# Patient Record
Sex: Male | Born: 1945 | Race: White | Hispanic: Yes | Marital: Married | Smoking: Former smoker
Health system: Southern US, Community
[De-identification: ages and names within clinical notes are randomized; demographics above are authoritative.]

## PROBLEM LIST (undated history)

## (undated) DIAGNOSIS — N289 Disorder of kidney and ureter, unspecified: Secondary | ICD-10-CM

## (undated) DIAGNOSIS — I10 Essential (primary) hypertension: Secondary | ICD-10-CM

## (undated) DIAGNOSIS — Z992 Dependence on renal dialysis: Secondary | ICD-10-CM

## (undated) DIAGNOSIS — E119 Type 2 diabetes mellitus without complications: Secondary | ICD-10-CM

## (undated) DIAGNOSIS — N186 End stage renal disease: Secondary | ICD-10-CM

## (undated) HISTORY — PX: HERNIA REPAIR: SHX51

---

## 2009-12-11 ENCOUNTER — Inpatient Hospital Stay: Payer: Self-pay | Admitting: Internal Medicine

## 2010-01-04 ENCOUNTER — Inpatient Hospital Stay: Payer: Self-pay | Admitting: Internal Medicine

## 2010-01-25 ENCOUNTER — Ambulatory Visit: Payer: Self-pay | Admitting: Vascular Surgery

## 2010-02-21 ENCOUNTER — Emergency Department: Payer: Self-pay | Admitting: Unknown Physician Specialty

## 2010-03-13 ENCOUNTER — Ambulatory Visit: Payer: Self-pay | Admitting: Vascular Surgery

## 2010-03-22 ENCOUNTER — Ambulatory Visit: Payer: Self-pay | Admitting: Vascular Surgery

## 2010-04-05 ENCOUNTER — Ambulatory Visit: Payer: Self-pay | Admitting: Surgery

## 2010-04-12 ENCOUNTER — Ambulatory Visit: Payer: Self-pay | Admitting: Surgery

## 2010-05-20 ENCOUNTER — Ambulatory Visit: Payer: Self-pay | Admitting: Vascular Surgery

## 2010-06-04 ENCOUNTER — Inpatient Hospital Stay: Payer: Self-pay | Admitting: Internal Medicine

## 2016-01-07 ENCOUNTER — Encounter: Payer: Self-pay | Admitting: Emergency Medicine

## 2016-01-07 ENCOUNTER — Emergency Department
Admission: EM | Admit: 2016-01-07 | Discharge: 2016-01-07 | Disposition: A | Discharge status: Home / Self Care | Payer: Medicare Other | Attending: Emergency Medicine | Admitting: Emergency Medicine

## 2016-01-07 DIAGNOSIS — Z87891 Personal history of nicotine dependence: Secondary | ICD-10-CM | POA: Insufficient documentation

## 2016-01-07 DIAGNOSIS — Z992 Dependence on renal dialysis: Secondary | ICD-10-CM | POA: Diagnosis not present

## 2016-01-07 DIAGNOSIS — I120 Hypertensive chronic kidney disease with stage 5 chronic kidney disease or end stage renal disease: Secondary | ICD-10-CM | POA: Diagnosis not present

## 2016-01-07 DIAGNOSIS — N186 End stage renal disease: Secondary | ICD-10-CM | POA: Diagnosis not present

## 2016-01-07 DIAGNOSIS — E119 Type 2 diabetes mellitus without complications: Secondary | ICD-10-CM | POA: Diagnosis not present

## 2016-01-07 HISTORY — DX: Essential (primary) hypertension: I10

## 2016-01-07 HISTORY — DX: Type 2 diabetes mellitus without complications: E11.9

## 2016-01-07 HISTORY — DX: Disorder of kidney and ureter, unspecified: N28.9

## 2016-01-07 LAB — CBC WITH DIFFERENTIAL/PLATELET
BASOS ABS: 0 10*3/uL (ref 0–0.1)
BASOS PCT: 1 %
Eosinophils Absolute: 0.5 10*3/uL (ref 0–0.7)
Eosinophils Relative: 7 %
HEMATOCRIT: 30.2 % — AB (ref 40.0–52.0)
Hemoglobin: 10.2 g/dL — ABNORMAL LOW (ref 13.0–18.0)
Lymphocytes Relative: 22 %
Lymphs Abs: 1.5 10*3/uL (ref 1.0–3.6)
MCH: 32.4 pg (ref 26.0–34.0)
MCHC: 33.7 g/dL (ref 32.0–36.0)
MCV: 96.2 fL (ref 80.0–100.0)
MONO ABS: 0.6 10*3/uL (ref 0.2–1.0)
Monocytes Relative: 9 %
NEUTROS PCT: 61 %
Neutro Abs: 4.3 10*3/uL (ref 1.4–6.5)
PLATELETS: 156 10*3/uL (ref 150–440)
RBC: 3.14 MIL/uL — ABNORMAL LOW (ref 4.40–5.90)
RDW: 14.3 % (ref 11.5–14.5)
WBC: 6.9 10*3/uL (ref 3.8–10.6)

## 2016-01-07 LAB — COMPREHENSIVE METABOLIC PANEL
ALBUMIN: 3.3 g/dL — AB (ref 3.5–5.0)
ALT: 17 U/L (ref 17–63)
AST: 20 U/L (ref 15–41)
Alkaline Phosphatase: 114 U/L (ref 38–126)
Anion gap: 14 (ref 5–15)
BILIRUBIN TOTAL: 1 mg/dL (ref 0.3–1.2)
BUN: 77 mg/dL — AB (ref 6–20)
CO2: 27 mmol/L (ref 22–32)
Calcium: 9.5 mg/dL (ref 8.9–10.3)
Chloride: 92 mmol/L — ABNORMAL LOW (ref 101–111)
Creatinine, Ser: 11.14 mg/dL — ABNORMAL HIGH (ref 0.61–1.24)
GFR calc Af Amer: 5 mL/min — ABNORMAL LOW (ref >60)
GFR calc non Af Amer: 4 mL/min — ABNORMAL LOW (ref >60)
GLUCOSE: 221 mg/dL — AB (ref 65–99)
POTASSIUM: 4.5 mmol/L (ref 3.5–5.1)
Sodium: 133 mmol/L — ABNORMAL LOW (ref 135–145)
Total Protein: 7 g/dL (ref 6.5–8.1)

## 2016-01-07 NOTE — ED Notes (Signed)
Interpreter with pt. Pt alert. Family with pt.

## 2016-01-07 NOTE — ED Notes (Signed)
Patient states he is here for dialysis.  [place] [facility] [place] [facility] [place] and had a family emergency, so [place].  Patient has dialysis on Monday, Wednesday, and Friday.  Last dialysis treatment on Friday March 3.  Patient denies all complaints.

## 2016-01-07 NOTE — ED Notes (Signed)
Pt states he had a death in the family and pt got here today from [place]. Pt due for dialysis today, last dialysis was 3days ago.  No sob. No chest pain.  Pt states i just want my dialysis and go home.  md in with pt.

## 2016-01-07 NOTE — ED Notes (Signed)
md in with pt.  Interpreter with pt.  Pt left and will return tomorrow for dialysis.  Family with pt.

## 2016-01-07 NOTE — Discharge Instructions (Signed)
Please return to the emergency room immediately if you develop chest pain, trouble breathing, or become short of breath, or other concerns arise. We have discussed, you were offered admission to the hospital, but indicated that you need to spend the evening with family. You are due for your dialysis and missing a treatment or going additional time without dialysis can be life threatening or result in death.  Please return to the emergency room tomorrow morning for reevaluation to have your dialysis performed.  Dilisis (Dialysis) La dilisis es un procedimiento que reemplaza parte de la funcin que Apache Corporationrealizan los riones sanos. Se realiza cuando se pierde alrededor del 85 al 90% de la funcin renal. Tambin puede indicarse antes si tuviera la posibilidad de que los sntomas mejoraran con la dilisis. Durante la dilisis, se eliminan los desechos, sales y Sports coachagua en exceso de la Elizabethsangre, y se Engineer, sitemantiene el nivel de ciertas sustancias qumicas en la sangre (como el potasio). La dilisis se realiza en sesiones. Las sesiones de dilisis se continan hasta que los riones Comptonmejoran. Si los riones no mejoran, como sucede en la enfermedad renal terminal, la dilisis se contina de por vida o hasta que pueda recibir un nuevo rin (trasplante renal). Hay dos tipos de dilisis: hemodilisis y dilisis peritoneal. QU ES LA HEMODILISIS?  La hemodilisis es un tipo de dilisis en la que se utiliza una mquina llamada dializador para Hydrologistfiltrar la Dixie Unionsangre. Antes de comenzar la hemodilisis, le harn una ciruga para crear un sitio en el que la sangre pueda extraerse del cuerpo y volver a ingresar en l (acceso vascular). Hay tres tipos de accesos vasculares:  Fstula arteriovenosa. Para crear este tipo de acceso, se conecta una arteria a una vena (generalmente en el brazo). La fstula demora entre 1 y 6 meses para desarrollarse despus de la Azerbaijanciruga. Si se desarrolla adecuadamente, generalmente su duracin es mayor que los  otros tipos de accesos vasculares. Tambin es menos probable que se infecte y se formen cogulos sanguneos.  Injerto arteriovenoso. Para crear este tipo de acceso, se conectan una arteria y Burkina Fasouna vena del brazo con un tubo. El injerto puede usarse de 2 a 3 semanas despus de la Azerbaijanciruga.  Catter venoso. Para crear este tipo de acceso, se coloca un tubo delgado y flexible (catter) en una vena grande del cuello, el trax o la ingle. El catter puede usarse inmediatamente. Por lo general, se Botswanausa como un acceso temporario cuando es necesario que la dilisis comience de inmediato. Durante la hemodilisis, la Quest Diagnosticssangre sale del cuerpo a travs del Teacher, adult educationacceso. Viaja a travs del tubo al dializador, donde se Research scientist (medical)filtra. Luego la sangre retorna al cuerpo a travs de otro tubo. La hemodilisis generalmente la realiza un mdico en el hospital o en un centro de dilisis, tres veces por semana. Las sesiones duran entre 3 y 4 horas. Tambin puede Allied Waste Industrieshacerse en el hogar, con la ayuda de una persona entrenada.  QU ES LA DILISIS PERITONEAL? La dilisis peritoneal es un tipo de dilisis en el que se Botswanausa como filtro la delgada membrana que cubre el abdomen (peritoneo). Antes de comenzar con la dilisis peritoneal, le harn una ciruga para colocar un catter en el abdomen. El catter se usar para introducir y Brewing technologistextraer del abdomen un lquido llamado dialisato. Al inicio de la sesin, llenarn su abdomen con dialisato. Durante la sesin, los desechos, las sales y los lquidos en exceso de la sangre pasan a travs del peritoneo y Albionhacia el dialisato. El dialisato se drena  del cuerpo al finalizar la sesin. El proceso de llenar y drenar el dialisato se llama intercambio. Los intercambios se repiten [employer] haya usado todo el dialisato del da. La dilisis peritoneal puede llevarse a cabo en el hogar o en casi cualquier Interior and spatial designer. Se realiza [employer]. Es posible que necesite hasta cinco intercambios por Futures trader. La cantidad de tiempo que  permanece el dialisato en el organismo entre los intercambios se llama tiempo de permanencia. El tiempo de permanencia depende del nmero de intercambios necesarios y de las caractersticas del peritoneo. Generalmente, oscila entre 1,5y 3horas. Podr llevar una vida normal Cendant Corporation. Como Civil Service fast streamer, los intercambios podrn realizarse durante la noche, Sycamore duerme, con un dispositivo Careers information officer. QU TIPO DE DILISIS DEBO ELEGIR?  Tanto la hemodilisis como la dilisis peritoneal tienen ventajas y desventajas. Hable con su mdico acerca de qu tipo de dilisis es el mejor para usted. Hay que considerar sus preferencias, su estilo de vida y su enfermedad. En algunos casos, puede elegirse solo un tipo de dilisis.  Ventajas de la hemodilisis  Se realiza con menos frecuencia que la dilisis peritoneal.  Otra persona puede hacer la dilisis por usted.  Si concurre a Presenter, broadcasting en dilisis, Garment/textile technologist los problemas inmediatamente.  En el centro de dilisis, podr interactuar con otras personas que se estn haciendo dilisis. Aqu podr encontrar apoyo emocional. Desventajas de la hemodilisis  La hemodilisis puede causar calambres e hipotensin arterial. Puede dejarle una sensacin de [employer] en que le realizan el [place].  Si concurre a un centro de dilisis, deber concertar citas semanales en los horarios disponibles del centro.  Deber tener ms cuidado cuando viaje. Si concurre a Heritage manager de dilisis ser necesario que haga arreglos para Clinical research associate un centro de dilisis cercano a su casa. Si realiza el tratamiento en su casa, ser necesario que lleve el dializador con usted hasta su lugar de destino.  Ser necesario que evite ms alimentos de los que debe evitar en la dilisis peritoneal. Ventajas de la dilisis peritoneal  Es menos probable que cause calambres e hipotensin arterial.  Podr realizar los intercambios  usted mismo, donde se encuentre, incluso cuando viaje.  No es necesario que evite tantos alimentos como en la hemodilisis. Desventajas de la dilisis peritoneal  Debe realizarse con ms frecuencia que la hemodilisis.  La dilisis peritoneal requiere destreza con las manos. Tambin debe ser capaz de levantar bolsas.  Tendr que aprender tcnicas de esterilizacin. Deber practicarlas [employer] para reducir el riesgo de infeccin. QU CAMBIOS DEBER HACER EN LA DIETA DURANTE LA DILISIS? Tanto en la hemodilisis como en la dilisis peritoneal se requiere que haga algunos cambios en su dieta. Por ejemplo, deber limitar su ingesta de alimentos con elevado contenido de fsforo y Government social research officer. Tambin deber limitar su ingesta de lquidos. Su nutricionista puede ayudar a planificar sus comidas. Un buen plan de comidas puede mejorar la dilisis y [address]  QU DEBO ESPERAR AL COMENZAR LA DILISIS? Adaptarse al tratamiento de dilisis, a las citas programadas y a Field seismologist. Puede que sea necesario que deje de trabajar y no pueda hacer algunas de las cosas que normalmente hace. Es probable que se sienta ansioso o deprimido cuando inicie la dilisis. Con el tiempo, muchas personas se sienten mejor debido a la dilisis. Algunos pueden volver a trabajar despus de Advertising copywriter cambios, como disminuir la intensidad del [place]. DNDE Raelyn Mora MS INFORMACIN?  Fundacin Nacional del Rin (National Kidney Foundation): www.kidney.org  Asociacin Americana de Pacientes Renales (American Association of Kidney Patients, AAKP): ResidentialShow.is  Fondo Americano para Problemas Renales (American Kidney Fund): www.kidneyfund.org   Esta informacin no tiene Theme park manager el consejo del mdico. Asegrese de hacerle al mdico cualquier pregunta que tenga.   Document Released: 10/20/2005 Document Revised: 11/10/2014 Elsevier Interactive Patient Education Microsoft.

## 2016-01-07 NOTE — ED Provider Notes (Addendum)
[facility] Emergency Department Provider Note  ____________________________________________  Time seen: Approximately 9:18 PM  I have reviewed the triage vital signs and the nursing notes.   HISTORY  Chief Complaint needs dialysis     HPI Jermaine Carpenter is a 70 y.o. male a history of end-stage renal disease on Monday Wednesday Friday dialysis. Last treatment was Friday, and he came here for a funeral. He reports that he is due for dialysis today. He denies any complaint. He isn't established dialysis patient in [place] and generally gets his dialysis through his fistula in the right arm. He denies any shortness of breath, chest pain, trouble breathing, swelling.   Past Medical History  Diagnosis Date  . Renal disorder     ESRD, dialysis  . Diabetes mellitus without complication (HCC)   . Hypertension     There are no active problems to display for this patient.   Past Surgical History  Procedure Laterality Date  . Hernia repair      No current outpatient prescriptions on file.  Allergies Review of patient's allergies indicates no known allergies.  No family history on file.  Social History Social History  Substance Use Topics  . Smoking status: Former Games developer  . Smokeless tobacco: Never Used  . Alcohol Use: No    Review of Systems Constitutional: No fever/chills Eyes: No visual changes. ENT: No sore throat. Cardiovascular: Denies chest pain. Respiratory: Denies shortness of breath. Gastrointestinal: No abdominal pain.  No nausea, no vomiting.  No diarrhea.  No constipation. Genitourinary: Negative for dysuria. Musculoskeletal: Negative for back pain. Skin: Negative for rash. Neurological: Negative for headaches, focal weakness or numbness.  10-point ROS otherwise negative.  ____________________________________________   PHYSICAL EXAM:  VITAL SIGNS: ED Triage Vitals  Enc Vitals Group     BP --      Pulse Rate 01/07/16  1701 77     Resp 01/07/16 1701 18     Temp 01/07/16 1701 98.5 F (36.9 C)     Temp Source 01/07/16 1701 Oral     SpO2 01/07/16 1701 96 %     Weight 01/07/16 1701 157 lb 10.1 oz (71.5 kg)     Height 01/07/16 1701  (1.702 m)     Head Cir --      Peak Flow --      Pain Score 01/07/16 1703 0     Pain Loc --      Pain Edu? --      Excl. in GC? --    Constitutional: Alert and oriented. Well appearing and in no acute distress. Eyes: Conjunctivae are normalOn the left. 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 Nose: No congestion/rhinnorhea. Mouth/Throat: Mucous membranes are moist.  Oropharynx non-erythematous. Neck: No stridor.   Cardiovascular: Normal rate, regular rhythm. Grossly normal heart sounds.  Good peripheral circulation. Respiratory: Normal respiratory effort.  No retractions. Lungs CTAB. Gastrointestinal: Soft and nontender. No distention. No abdominal bruits. No CVA tenderness. Musculoskeletal: No lower extremity tenderness nor edema.  Neurologic:  Normal speech and language. No gross focal neurologic deficits are appreciated. No gait instability. Skin:  Skin is warm, dry and intact. No rash noted. Right upper extremity fistula with normal thrill no erythema. Psychiatric: Mood and affect are normal. Speech and behavior are normal.  ____________________________________________   LABS (all labs ordered are listed, but only abnormal results are displayed)  Labs Reviewed  CBC WITH DIFFERENTIAL/PLATELET - Abnormal; Notable for the following:    RBC 3.14 (*)  Hemoglobin 10.2 (*)    HCT 30.2 (*)    All other components within normal limits  COMPREHENSIVE METABOLIC PANEL - Abnormal; Notable for the following:    Sodium 133 (*)    Chloride 92 (*)    Glucose, Bld 221 (*)    BUN 77 (*)    Creatinine, Ser 11.14 (*)    Albumin 3.3 (*)    GFR calc non Af Amer 4 (*)    GFR calc Af Amer 5 (*)    All other components within normal limits    ____________________________________________  EKG   ____________________________________________  RADIOLOGY   ____________________________________________   PROCEDURES  Procedure(s) performed: None  Critical Care performed: No  ____________________________________________   INITIAL IMPRESSION / ASSESSMENT AND PLAN / ED COURSE  Pertinent labs & imaging results that were available during my care of the patient were reviewed by me and considered in my medical decision making (see chart for details).  Patient presents for evaluation for scheduled dialysis which he has not had because of travel. He appears stable. He is in no distress. No hypoxia and clear lung sounds. No evidence of volume overload or failure. Potassium is normal at 4.5. He has no complaint to suggest emergent need for dialysis and his clinical exam also supports that he is not in distress and non-requirement of emergent dialysis at this time.  I called and discussed the patient's case with Dr. Ronn Melena of nephrology who reviewed the patient's case and notes and advises that unfortunately the patient's only option to receive dialysis and get set up in the local area while traveling would be to admit him tonight and have nephrology see him for dialysis tomorrow morning. Discussed with the patient as family, they are amenable to this plan. I did discuss with nephrology that patient is currently asymptomatic, he appears stable and does not seem to be in need of emergent dialysis, however given the patient's anticipated need for dialysis and that he'll be staying in the [place] area for several days they have very few options but to admit him at this time and establish dialysis schedule, otherwise only other option might be to come back if he develops symptoms such as dyspnea or a need for emergent dialysis which is suboptimal, and potentially an unsafe disposition at this time given he cannot be started on outpatient  regimen promptly.  Patient admitted ____________________________________________   FINAL CLINICAL IMPRESSION(S) / ED DIAGNOSES  Final diagnoses:  End stage renal disease on dialysis [facility])      Sharyn Creamer, MD 01/07/16 2135   ----------------------------------------- 10:50 PM on 01/07/2016 -----------------------------------------  The patient changed his mind and refused admission as he reports that he needs time with his family this evening given the loss and death of his daughter. He clearly communicated understanding the reason for admission and the risks of not having dialysis including possible death. His family will be taking him home and he will be returning to the emergency room at about 5:30 or 6 AM in the morning for reevaluation to set up his dialysis. The patient and his family had a lengthy discussion with myself via Spanish interpreter and everyone seems to be understanding and in agreement with the plan for him to be discharged, though I did recommend admission, but he will return in the morning or sooner if he develops any concerning symptoms which were reviewed carefully. He understands the plan and will be returning to the emergency room for reevaluation to have dialysis initiated tomorrow.  Sharyn CreamerMark Zori Benbrook, MD 01/07/16 2252

## 2016-01-08 ENCOUNTER — Observation Stay
Admission: EM | Admit: 2016-01-08 | Discharge: 2016-01-08 | Discharge status: Against Medical Advice | Payer: Medicare Other | Attending: Specialist | Admitting: Specialist

## 2016-01-08 ENCOUNTER — Emergency Department: Payer: Medicare Other

## 2016-01-08 ENCOUNTER — Ambulatory Visit
Admission: RE | Admit: 2016-01-08 | Discharge: 2016-01-08 | Disposition: A | Discharge status: Home / Self Care | Payer: Medicare Other | Source: Ambulatory Visit | Attending: Internal Medicine | Admitting: Internal Medicine

## 2016-01-08 ENCOUNTER — Ambulatory Visit: Payer: Medicare Other

## 2016-01-08 ENCOUNTER — Encounter: Payer: Self-pay | Admitting: Emergency Medicine

## 2016-01-08 DIAGNOSIS — Z79899 Other long term (current) drug therapy: Secondary | ICD-10-CM | POA: Insufficient documentation

## 2016-01-08 DIAGNOSIS — J189 Pneumonia, unspecified organism: Secondary | ICD-10-CM | POA: Insufficient documentation

## 2016-01-08 DIAGNOSIS — D631 Anemia in chronic kidney disease: Secondary | ICD-10-CM | POA: Diagnosis not present

## 2016-01-08 DIAGNOSIS — Z87891 Personal history of nicotine dependence: Secondary | ICD-10-CM | POA: Diagnosis not present

## 2016-01-08 DIAGNOSIS — E1122 Type 2 diabetes mellitus with diabetic chronic kidney disease: Principal | ICD-10-CM | POA: Insufficient documentation

## 2016-01-08 DIAGNOSIS — Z992 Dependence on renal dialysis: Secondary | ICD-10-CM | POA: Insufficient documentation

## 2016-01-08 DIAGNOSIS — N186 End stage renal disease: Secondary | ICD-10-CM

## 2016-01-08 DIAGNOSIS — J988 Other specified respiratory disorders: Secondary | ICD-10-CM | POA: Diagnosis not present

## 2016-01-08 DIAGNOSIS — I440 Atrioventricular block, first degree: Secondary | ICD-10-CM | POA: Diagnosis not present

## 2016-01-08 DIAGNOSIS — R05 Cough: Secondary | ICD-10-CM | POA: Insufficient documentation

## 2016-01-08 DIAGNOSIS — R778 Other specified abnormalities of plasma proteins: Secondary | ICD-10-CM | POA: Insufficient documentation

## 2016-01-08 DIAGNOSIS — I447 Left bundle-branch block, unspecified: Secondary | ICD-10-CM | POA: Diagnosis not present

## 2016-01-08 DIAGNOSIS — Z794 Long term (current) use of insulin: Secondary | ICD-10-CM | POA: Diagnosis not present

## 2016-01-08 DIAGNOSIS — R7989 Other specified abnormal findings of blood chemistry: Secondary | ICD-10-CM

## 2016-01-08 DIAGNOSIS — I120 Hypertensive chronic kidney disease with stage 5 chronic kidney disease or end stage renal disease: Secondary | ICD-10-CM | POA: Insufficient documentation

## 2016-01-08 HISTORY — DX: End stage renal disease: N18.6

## 2016-01-08 HISTORY — DX: Dependence on renal dialysis: Z99.2

## 2016-01-08 LAB — RENAL FUNCTION PANEL
Albumin: 3.1 g/dL — ABNORMAL LOW (ref 3.5–5.0)
Anion gap: 15 (ref 5–15)
BUN: 86 mg/dL — ABNORMAL HIGH (ref 6–20)
CALCIUM: 9.5 mg/dL (ref 8.9–10.3)
CO2: 25 mmol/L (ref 22–32)
CREATININE: 12.18 mg/dL — AB (ref 0.61–1.24)
Chloride: 95 mmol/L — ABNORMAL LOW (ref 101–111)
GFR, EST AFRICAN AMERICAN: 4 mL/min — AB (ref >60)
GFR, EST NON AFRICAN AMERICAN: 4 mL/min — AB (ref >60)
Glucose, Bld: 213 mg/dL — ABNORMAL HIGH (ref 65–99)
PHOSPHORUS: 5.7 mg/dL — AB (ref 2.5–4.6)
Potassium: 4.8 mmol/L (ref 3.5–5.1)
SODIUM: 135 mmol/L (ref 135–145)

## 2016-01-08 LAB — COMPREHENSIVE METABOLIC PANEL
ALK PHOS: 103 U/L (ref 38–126)
ALT: 15 U/L — ABNORMAL LOW (ref 17–63)
ANION GAP: 13 (ref 5–15)
AST: 19 U/L (ref 15–41)
Albumin: 3.2 g/dL — ABNORMAL LOW (ref 3.5–5.0)
BILIRUBIN TOTAL: 0.6 mg/dL (ref 0.3–1.2)
BUN: 87 mg/dL — AB (ref 6–20)
CALCIUM: 9.2 mg/dL (ref 8.9–10.3)
CO2: 27 mmol/L (ref 22–32)
Chloride: 95 mmol/L — ABNORMAL LOW (ref 101–111)
Creatinine, Ser: 11.75 mg/dL — ABNORMAL HIGH (ref 0.61–1.24)
GFR calc Af Amer: 4 mL/min — ABNORMAL LOW (ref >60)
GFR, EST NON AFRICAN AMERICAN: 4 mL/min — AB (ref >60)
GLUCOSE: 274 mg/dL — AB (ref 65–99)
Potassium: 4.5 mmol/L (ref 3.5–5.1)
Sodium: 135 mmol/L (ref 135–145)
TOTAL PROTEIN: 6.8 g/dL (ref 6.5–8.1)

## 2016-01-08 LAB — CBC
HEMATOCRIT: 30.3 % — AB (ref 40.0–52.0)
Hemoglobin: 10.3 g/dL — ABNORMAL LOW (ref 13.0–18.0)
MCH: 32.9 pg (ref 26.0–34.0)
MCHC: 33.9 g/dL (ref 32.0–36.0)
MCV: 97 fL (ref 80.0–100.0)
PLATELETS: 160 10*3/uL (ref 150–440)
RBC: 3.12 MIL/uL — ABNORMAL LOW (ref 4.40–5.90)
RDW: 14.7 % — AB (ref 11.5–14.5)
WBC: 6.1 10*3/uL (ref 3.8–10.6)

## 2016-01-08 LAB — TROPONIN I: Troponin I: 0.11 ng/mL — ABNORMAL HIGH (ref <0.031)

## 2016-01-08 MED ORDER — ISOSORBIDE MONONITRATE ER 30 MG PO TB24: 30.0000 mg | ORAL_TABLET | Freq: Every day | ORAL | Status: DC

## 2016-01-08 MED ORDER — CARVEDILOL 6.25 MG PO TABS: 12.5000 mg | ORAL_TABLET | Freq: Two times a day (BID) | ORAL | Status: DC

## 2016-01-08 MED ORDER — HEPARIN SODIUM (PORCINE) 5000 UNIT/ML IJ SOLN: 5000.0000 [IU] | Freq: Three times a day (TID) | INTRAMUSCULAR | Status: DC

## 2016-01-08 MED ORDER — LEVOFLOXACIN 500 MG PO TABS: 250.0000 mg | ORAL_TABLET | ORAL | Status: DC

## 2016-01-08 MED ORDER — LOSARTAN POTASSIUM 50 MG PO TABS: 100.0000 mg | ORAL_TABLET | Freq: Every day | ORAL | Status: DC

## 2016-01-08 MED ORDER — DOXAZOSIN MESYLATE 1 MG PO TABS: 2.0000 mg | ORAL_TABLET | Freq: Every day | ORAL | Status: DC

## 2016-01-08 MED ORDER — ONDANSETRON HCL 4 MG PO TABS: 4.0000 mg | ORAL_TABLET | Freq: Four times a day (QID) | ORAL | Status: DC | PRN

## 2016-01-08 MED ORDER — INSULIN ASPART 100 UNIT/ML ~~LOC~~ SOLN: 0.0000 [IU] | Freq: Every day | SUBCUTANEOUS | Status: DC

## 2016-01-08 MED ORDER — CALCIUM ACETATE (PHOS BINDER) 667 MG PO CAPS: 1334.0000 mg | ORAL_CAPSULE | Freq: Three times a day (TID) | ORAL | Status: DC

## 2016-01-08 MED ORDER — INSULIN ASPART 100 UNIT/ML ~~LOC~~ SOLN: 0.0000 [IU] | Freq: Three times a day (TID) | SUBCUTANEOUS | Status: DC

## 2016-01-08 MED ORDER — PANTOPRAZOLE SODIUM 40 MG PO TBEC: 40.0000 mg | DELAYED_RELEASE_TABLET | Freq: Every day | ORAL | Status: DC

## 2016-01-08 MED ORDER — ACETAMINOPHEN 650 MG RE SUPP: 650.0000 mg | Freq: Four times a day (QID) | RECTAL | Status: DC | PRN

## 2016-01-08 MED ORDER — ACETAMINOPHEN 325 MG PO TABS: 650.0000 mg | ORAL_TABLET | Freq: Four times a day (QID) | ORAL | Status: DC | PRN

## 2016-01-08 MED ORDER — LEVOFLOXACIN 500 MG PO TABS: 500.0000 mg | ORAL_TABLET | Freq: Once | ORAL | Status: DC

## 2016-01-08 MED ORDER — ONDANSETRON HCL 4 MG/2ML IJ SOLN: 4.0000 mg | Freq: Four times a day (QID) | INTRAMUSCULAR | Status: DC | PRN

## 2016-01-08 NOTE — Progress Notes (Signed)
Patient states to interpreter that he wants to leave. MD notified. AMA form printed and signed. Waiting on family to arrive to pick up patient.

## 2016-01-08 NOTE — H&P (Addendum)
[facility] [place] [facility]   PATIENT NAME: Jermaine Carpenter    MR#:  [id number]  DATE OF BIRTH:  12/13/1945  DATE OF ADMISSION:  01/08/2016  PRIMARY CARE PHYSICIAN: No primary care provider on file.   REQUESTING/REFERRING PHYSICIAN: Dr. Daryel NovemberJonathan Williams  CHIEF COMPLAINT:   Chief Complaint  Patient presents with  . dialysis     HISTORY OF PRESENT ILLNESS:  Jermaine Hughntonio Salone  is a 70 y.o. male with a known history of stage renal disease on hemodialysis, diabetes, hypertension who presented to the hospital as he has missed his dialysis this past Monday. [place] recently [place] to [place] from [place] due to a death in his family. He did not arrange dialysis prior to leaving [place]. His normally scheduled to get hemodialysis on Monday Wednesday Friday and received his hemodialysis this past Friday. He is complaining of some minimal shortness of breath, and a cough which is productive but sometimes green sputum. He denies any fevers, chills, nausea, vomiting, chest pain, abdominal pain or any other associated symptoms. Hospitalist services were contacted further treatment and evaluation.  PAST MEDICAL HISTORY:   Past Medical History  Diagnosis Date  . Renal disorder     ESRD, dialysis  . Diabetes mellitus without complication (HCC)   . Hypertension   . End-stage renal disease on hemodialysis (HCC)     PAST SURGICAL HISTORY:   Past Surgical History  Procedure Laterality Date  . Hernia repair      SOCIAL HISTORY:   Social History  Substance Use Topics  . Smoking status: Former Games developermoker  . Smokeless tobacco: Never Used  . Alcohol Use: No    FAMILY HISTORY:  No family history on file. Pt. Cannot recall his family hx for his parents. He does say that Diabetes runs in his family.    DRUG ALLERGIES:  No Known Allergies  REVIEW OF SYSTEMS:   Review of Systems  Constitutional: Negative for fever and weight loss.  HENT: Negative  for congestion, nosebleeds and tinnitus.   Eyes: Negative for blurred vision, double vision and redness.  Respiratory: Positive for cough, sputum production and shortness of breath. Negative for hemoptysis.   Cardiovascular: Negative for chest pain, orthopnea, leg swelling and PND.  Gastrointestinal: Negative for nausea, vomiting, abdominal pain, diarrhea and melena.  Genitourinary: Negative for dysuria, urgency and hematuria.  Musculoskeletal: Negative for joint pain and falls.  Neurological: Negative for dizziness, tingling, sensory change, focal weakness, seizures, weakness and headaches.  Endo/Heme/Allergies: Negative for polydipsia. Does not bruise/bleed easily.  Psychiatric/Behavioral: Negative for depression and memory loss. The patient is not nervous/anxious.     MEDICATIONS AT HOME:   Prior to Admission medications   Not on File      VITAL SIGNS:  Blood pressure 177/72, pulse 75, temperature 97.5 F (36.4 C), resp. rate 20, height 5\' 7"  (1.702 m), weight 71.215 kg (157 lb), SpO2 97 %.  PHYSICAL EXAMINATION:  Physical Exam  GENERAL:  70 y.o.-year-old patient lying in the bed with no acute distress.  EYES: Pupils equal, round, reactive to light and accommodation. No scleral icterus. Extraocular muscles intact.  HEENT: Head atraumatic, normocephalic. Oropharynx and nasopharynx clear. No oropharyngeal erythema, moist oral mucosa  NECK:  Supple, no jugular venous distention. No thyroid enlargement, no tenderness.  LUNGS: Normal breath sounds bilaterally, no wheezing, rales, some rhonchi b/l. No use of accessory muscles of respiration.  CARDIOVASCULAR: S1, S2 RRR. No murmurs, rubs, gallops, clicks.  ABDOMEN: Soft, nontender, nondistended. Bowel  sounds present. No organomegaly or mass.  EXTREMITIES: No pedal edema, cyanosis, or clubbing. + 2 pedal & radial pulses b/l.   NEUROLOGIC: Cranial nerves II through XII are intact. No focal Motor or sensory deficits appreciated  b/l PSYCHIATRIC: The patient is alert and oriented x 3. Good affect.  SKIN: No obvious rash, lesion, or ulcer.   Right upper extremity AV fistula with good bruit and good thrill.  LABORATORY PANEL:   CBC  Recent Labs Lab 01/08/16 0731  WBC 6.1  HGB 10.3*  HCT 30.3*  PLT 160   ------------------------------------------------------------------------------------------------------------------  Chemistries   Recent Labs Lab 01/08/16 0731  NA 135  K 4.5  CL 95*  CO2 27  GLUCOSE 274*  BUN 87*  CREATININE 11.75*  CALCIUM 9.2  AST 19  ALT 15*  ALKPHOS 103  BILITOT 0.6   ------------------------------------------------------------------------------------------------------------------  Cardiac Enzymes  Recent Labs Lab 01/08/16 0731  TROPONINI 0.11*   ------------------------------------------------------------------------------------------------------------------  RADIOLOGY:  Dg Chest Port 1 View  01/08/2016  CLINICAL DATA:  Renal failure patient with history of smoking, diabetes and hypertension. EXAM: PORTABLE CHEST 1 VIEW COMPARISON:  Radiographs 06/04/2010 and 01/04/2000) FINDINGS: 0717 hours. The heart size is stable for portable technique. Patient has a right-sided aortic arch. The pulmonary vascularity appears normal. There are patchy airspace opacities at both lung bases. No pleural effusion or pneumothorax is seen. The bones appear unremarkable. IMPRESSION: Patchy bibasilar atelectasis or early pneumonia. Correlate clinically. No evidence of edema. Electronically Signed   By: Carey Bullocks M.D.   On: 01/08/2016 07:28     IMPRESSION AND PLAN:   70 year old male with past medical history of end-stage renal disease on hemodialysis, hypertension, diabetes who presents to the hospital having missed his dialysis this past Monday.  #1 end-stage renal disease on hemodialysis-patient is traveling from [place] due to a death in his family.  He did not get dialysis  arranged prior to his travel. -Patient has some mild volume overload having missed his dialysis 2 days ago. [facility] consult nephrology and get him started on hemodialysis.  #2 pneumonia-patient has a productive cough with green-yellow sputum over the past few days. -Clinically he is afebrile, hemodynamically stable with a normal white cell count. -I will start him on Levaquin for now.  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 -We'll obtain his home medications and reconciled them later.  #4. Troponin-this is likely secondary to patient's poor renal clearance. No acute chest pain. -No need for further cardiac workup presently.  #5 anemia of chronic disease- due to ESRD.  - hemoglobin stable we will monitor.  All the records are reviewed and case discussed with ED provider. Management plans discussed with the patient, family and they are in agreement.  CODE STATUS: Full   TOTAL TIME TAKING CARE OF THIS PATIENT: 45 minutes.    Houston Siren M.D on 01/08/2016 at 9:05 AM  Between 7am to 6pm - Pager - [phone]  After 6pm go to [email or web address] - password [email or web address] [facility]  [employer] [facility]  Office  [phone]  CC: Primary care physician; No primary care provider on file.

## 2016-01-08 NOTE — Progress Notes (Signed)
Pre-HD tx,interpretor bedside

## 2016-01-08 NOTE — Progress Notes (Signed)
Post hd tx 

## 2016-01-08 NOTE — Care Management Note (Signed)
Case Management Note  Patient Details  Name: George Hughntonio Hallum MRN: [id number] Date of Birth: 07/22/1946  Subjective/Objective:      Patient here from [place] to bury his daughter. Spoke to grandson at bedside who is fluent in Budaenglish , and explained to him that although the patient is coming in, the HD will most likely NOT be covered, and represents a cost of around $1500. He has signed the OBSV letter. He has stated the plan was to contact [facility] locally where the pt. Had been treated previously, and I have explained that although that is a good ides, the [place] facility will most likely have to set things up, after they transfer the pts. Current information. He has agreed to add this call today. MD made aware.              Action/Plan:   Expected Discharge Date:                  Expected Discharge Plan:     In-House Referral:     Discharge planning Services     Post Acute Care Choice:    Choice offered to:     DME Arranged:    DME Agency:     HH Arranged:    HH Agency:     Status of Service:     Medicare Important Message Given:    Date Medicare IM Given:    Medicare IM give by:    Date Additional Medicare IM Given:    Additional Medicare Important Message give by:     If discussed at Long Length of Stay Meetings, dates discussed:    Additional Comments:  Berna BueCheryl Zyann Mabry, RN 01/08/2016, 9:06 AM

## 2016-01-08 NOTE — ED Provider Notes (Signed)
[facility] Emergency Department Provider Note     Time seen: ----------------------------------------- 7:12 AM on 01/08/2016 -----------------------------------------    I have reviewed the triage vital signs and the nursing notes.   HISTORY  Chief Complaint dialysis     HPI Jermaine Carpenter is a 70 y.o. male who presents to the ER with end-stage renal disease on Monday Wednesday and Friday dialysis. His last treatment was Friday, he is here from out of town. Patient was seen in ER yesterday but wanted to go spend time with family. He does have right AV fistula, has had some shortness of breath but denies any other complaints.   Past Medical History  Diagnosis Date  . Renal disorder     ESRD, dialysis  . Diabetes mellitus without complication (HCC)   . Hypertension     There are no active problems to display for this patient.   Past Surgical History  Procedure Laterality Date  . Hernia repair      Allergies Review of patient's allergies indicates no known allergies.  Social History Social History  Substance Use Topics  . Smoking status: Former Games developer  . Smokeless tobacco: Never Used  . Alcohol Use: No    Review of Systems Constitutional: Negative for fever. Eyes: Negative for visual changes. ENT: Negative for sore throat. Cardiovascular: Negative for chest pain. Respiratory: Positive for shortness of breath Gastrointestinal: Negative for abdominal pain, vomiting and diarrhea. Genitourinary: Negative for dysuria. Musculoskeletal: Negative for back pain. Skin: Negative for rash. Neurological: Negative for headaches, focal weakness or numbness.  10-point ROS otherwise negative.  ____________________________________________   PHYSICAL EXAM:  VITAL SIGNS: ED Triage Vitals  Enc Vitals Group     BP 01/08/16 0648 177/72 mmHg     Pulse Rate 01/08/16 0648 75     Resp 01/08/16 0648 20     Temp 01/08/16 0648 97.5 F (36.4 C)      Temp src --      SpO2 01/08/16 0648 97 %     Weight 01/08/16 0648 157 lb (71.215 kg)     Height 01/08/16 0648  (1.702 m)     Head Cir --      Peak Flow --      Pain Score --      Pain Loc --      Pain Edu? --      Excl. in GC? --     Constitutional: Alert and oriented. Well appearing and in no distress. Eyes: Conjunctivae are normal. PERRL. Normal extraocular movements. ENT   Head: Normocephalic and atraumatic.   Nose: No congestion/rhinnorhea.   Mouth/Throat: Mucous membranes are moist.   Neck: No stridor. Cardiovascular: Normal rate, regular rhythm. Normal and symmetric distal pulses are present in all extremities. No murmurs, rubs, or gallops. Respiratory: Normal respiratory effort without tachypnea nor retractions. Breath sounds are clear and equal bilaterally. No wheezes/rales/rhonchi. Gastrointestinal: Soft and nontender. No distention. No abdominal bruits.  Musculoskeletal: Nontender with normal range of motion in all extremities. No joint effusions.  No lower extremity tenderness nor edema. Neurologic:  Normal speech and language. No gross focal neurologic deficits are appreciated. Speech is normal. No gait instability. Skin:  Skin is warm, dry and intact. No rash noted. Psychiatric: Mood and affect are normal. Speech and behavior are normal. Patient exhibits appropriate insight and judgment. ____________________________________________  ED COURSE:  Pertinent labs & imaging results that were available during my care of the patient were reviewed by me and considered in my  medical decision making (see chart for details). Patient is in no acute distress, will check basic labs and reevaluate. ____________________________________________    LABS (pertinent positives/negatives)  Labs Reviewed  CBC - Abnormal; Notable for the following:    RBC 3.12 (*)    Hemoglobin 10.3 (*)    HCT 30.3 (*)    RDW 14.7 (*)    All other components within normal limits   TROPONIN I - Abnormal; Notable for the following:    Troponin I 0.11 (*)    All other components within normal limits  COMPREHENSIVE METABOLIC PANEL - Abnormal; Notable for the following:    Chloride 95 (*)    Glucose, Bld 274 (*)    BUN 87 (*)    Creatinine, Ser 11.75 (*)    Albumin 3.2 (*)    ALT 15 (*)    GFR calc non Af Amer 4 (*)    GFR calc Af Amer 4 (*)    All other components within normal limits   EKG: Interpreted by me, normal sinus rhythm with a rate of 69 bpm, first-degree AV block, wide QRS, normal QT interval. Left bundle branch block. Left axis deviation  RADIOLOGY Images were viewed by me  Chest x-ray IMPRESSION: Patchy bibasilar atelectasis or early pneumonia. Correlate clinically. No evidence of edema. ____________________________________________  FINAL ASSESSMENT AND PLAN  End-stage renal disease, elevated troponin  Plan: Patient with labs and imaging as dictated above. I will discuss with the dialysis liaison to see if we can arrange dialysis for the patient. Troponin is likely chronically elevated.   Emily FilbertWilliams, Jonathan E, MD   Emily FilbertJonathan E Williams, MD 01/08/16 [phone]

## 2016-01-08 NOTE — ED Notes (Signed)
Called report to floor. dava rn.

## 2016-01-08 NOTE — ED Notes (Signed)
Received report from sylvia rn care assumed.  

## 2016-01-08 NOTE — ED Notes (Signed)
Patient ambulatory to triage with steady gait, without difficulty or distress noted; pt was seen here last night and told to return this morning for possible admission for dialysis; pt from out of town visiting; denies any c/o

## 2016-01-08 NOTE — Progress Notes (Signed)
Hemodialysis completed. 

## 2016-01-08 NOTE — Progress Notes (Signed)
Pre-hd tx 

## 2016-01-08 NOTE — Consult Note (Signed)
[facility]  CONSULT NOTE    Date: 01/08/2016                  Patient Name:  Jermaine Carpenter  MRN: [id number]  DOB: 02-25-1946  Age / Sex: 70 y.o., male         PCP: No primary care provider on file.                 Service Requesting Consult: Dr. Fanny Bien                 Reason for Consult: End Stage Renal Disease            History of Present Illness: Mr. Jermaine Carpenter is a 70 y.o. Hispanic male with end stage renal disease on dialysis in [place] through right arm AVF, hypertension, anemia, secondary hyperparathyroidism, who was admitted to [facility] on 01/08/2016 for needs dialysis told to return  Patient had to come to [facility] as his daughter passed away on 03-21-23. His last hemodialysis treatment was on Friday. He presents to [facility] asking for dialysis. He has no complaints except a dry cough that he has had for more than 3 weeks.   Medications: Outpatient medications:  (Not in a hospital admission)  Current medications: Current Facility-Administered Medications  Medication Dose Route Frequency Provider Last Rate Last Dose  . insulin aspart (novoLOG) injection 0-5 Units  0-5 Units Subcutaneous QHS Houston Siren, MD      . insulin aspart (novoLOG) injection 0-9 Units  0-9 Units Subcutaneous TID WC Houston Siren, MD      . Melene Muller ON 01/10/2016] levofloxacin (LEVAQUIN) tablet 250 mg  250 mg Oral Q48H Houston Siren, MD      . levofloxacin (LEVAQUIN) tablet 500 mg  500 mg Oral Once Houston Siren, MD       Current Outpatient Prescriptions  Medication Sig Dispense Refill  . calcium acetate (PHOSLO) 667 MG capsule Take 1,334 mg by mouth 3 (three) times daily with meals.    . carvedilol (COREG) 12.5 MG tablet Take 12.5 mg by mouth 2 (two) times daily with a meal.    . doxazosin (CARDURA) 2 MG tablet Take 2 mg by mouth daily.    . isosorbide mononitrate (IMDUR) 30 MG 24 hr tablet Take 30 mg by mouth daily.    Marland Kitchen losartan (COZAAR)  100 MG tablet Take 100 mg by mouth daily.    Marland Kitchen omeprazole (PRILOSEC) 20 MG capsule Take 20 mg by mouth daily.        Allergies: No Known Allergies    Past Medical History: Past Medical History  Diagnosis Date  . Renal disorder     ESRD, dialysis  . Diabetes mellitus without complication (HCC)   . Hypertension   . End-stage renal disease on hemodialysis [facility])      Past Surgical History: Past Surgical History  Procedure Laterality Date  . Hernia repair       Family History: No family history on file.   Social History: Social History   Social History  . Marital Status: Married    Spouse Name: N/A  . Number of Children: N/A  . Years of Education: N/A   Occupational History  . Not on file.   Social History Main Topics  . Smoking status: Former Games developer  . Smokeless tobacco: Never Used  . Alcohol Use: No  . Drug Use: No  . Sexual Activity: Not on file  Other Topics Concern  . Not on file   Social History Narrative     Review of Systems: Review of Systems  Constitutional: Negative.  Negative for fever and chills.  HENT: Negative.  Negative for congestion, ear discharge, ear pain, hearing loss, nosebleeds, sore throat and tinnitus.   Eyes: Negative.  Negative for blurred vision, double vision, photophobia, pain, discharge and redness.  Respiratory: Positive for cough. Negative for hemoptysis, sputum production, shortness of breath, wheezing and stridor.   Cardiovascular: Negative.  Negative for chest pain, palpitations, orthopnea, claudication, leg swelling and PND.  Gastrointestinal: Negative.  Negative for heartburn, nausea, vomiting, abdominal pain, diarrhea, constipation, blood in stool and melena.  Genitourinary: Negative.  Negative for dysuria, urgency, frequency, hematuria and flank pain.  Musculoskeletal: Negative.  Negative for myalgias, back pain, joint pain, falls and neck pain.  Skin: Negative.  Negative for itching and rash.  Neurological:  Negative.  Negative for dizziness, tingling, tremors, sensory change, speech change, focal weakness, seizures, loss of consciousness and headaches.  Endo/Heme/Allergies: Negative.  Negative for environmental allergies and polydipsia. Does not bruise/bleed easily.  Psychiatric/Behavioral: Negative.  Negative for depression, suicidal ideas, hallucinations, memory loss and substance abuse. The patient is not nervous/anxious and does not have insomnia.     Vital Signs: Blood pressure 180/77, pulse 77, temperature 98 F (36.7 C), temperature source Oral, resp. rate 18, height  (1.702 m), weight 69 kg (152 lb 1.9 oz), SpO2 100 %.  Weight trends: Filed Weights   01/08/16 0648 01/08/16 0945 01/08/16 1315  Weight: 71.215 kg (157 lb) 71.2 kg (156 lb 15.5 oz) 69 kg (152 lb 1.9 oz)    Physical Exam: General: NAD, laying in bed  Head: Normocephalic, atraumatic. Moist oral mucosal membranes  Eyes: Anicteric, PERRL  Neck: Supple, trachea midline  Lungs:  Clear to auscultation  Heart: Regular rate and rhythm  Abdomen:  Soft, nontender,   Extremities: no peripheral edema.  Neurologic: Nonfocal, moving all four extremities  Skin: No lesions  Access: Right arm AVF     Lab results: Basic Metabolic Panel:  Recent Labs Lab 01/07/16 1707 01/08/16 0731 01/08/16 1027  NA 133* 135 135  K 4.5 4.5 4.8  CL 92* 95* 95*  CO2 GLUCOSE 221* 274* 213*  BUN 77* 87* 86*  CREATININE 11.14* 11.75* 12.18*  CALCIUM 9.5 9.2 9.5  PHOS  --   --  5.7*    Liver Function Tests:  Recent Labs Lab 01/07/16 1707 01/08/16 0731 01/08/16 1027  AST 20 19  --   ALT 17 15*  --   ALKPHOS 114 103  --   BILITOT 1.0 0.6  --   PROT 7.0 6.8  --   ALBUMIN 3.3* 3.2* 3.1*   No results for input(s): LIPASE, AMYLASE in the last 168 hours. No results for input(s): AMMONIA in the last 168 hours.  CBC:  Recent Labs Lab 01/07/16 1707 01/08/16 0731  WBC 6.9 6.1  NEUTROABS 4.3  --   HGB 10.2* 10.3*   HCT 30.2* 30.3*  MCV 96.2 97.0  PLT 156 160    Cardiac Enzymes:  Recent Labs Lab 01/08/16 0731  TROPONINI 0.11*    BNP: Invalid input(s): POCBNP  CBG: No results for input(s): GLUCAP in the last 168 hours.  Microbiology: No results found for this or any previous visit.  Coagulation Studies: No results for input(s): LABPROT, INR in the last 72 hours.  Urinalysis: No results for input(s): COLORURINE, LABSPEC, PHURINE, GLUCOSEU,  HGBUR, BILIRUBINUR, KETONESUR, PROTEINUR, UROBILINOGEN, NITRITE, LEUKOCYTESUR in the last 72 hours.  Invalid input(s): APPERANCEUR    Imaging: Dg Chest Port 1 View  01/08/2016  CLINICAL DATA:  Renal failure patient with history of smoking, diabetes and hypertension. EXAM: PORTABLE CHEST 1 VIEW COMPARISON:  Radiographs 06/04/2010 and 01/04/2000) FINDINGS: 0717 hours. The heart size is stable for portable technique. Patient has a right-sided aortic arch. The pulmonary vascularity appears normal. There are patchy airspace opacities at both lung bases. No pleural effusion or pneumothorax is seen. The bones appear unremarkable. IMPRESSION: Patchy bibasilar atelectasis or early pneumonia. Correlate clinically. No evidence of edema. Electronically Signed   By: Carey BullocksWilliam  Veazey M.D.   On: 01/08/2016 07:28      Assessment & Plan: Mr. Jermaine Carpenter is a 70 y.o. Hispanic male with end stage renal disease on dialysis in [place] through right arm AVF, hypertension, anemia, secondary hyperparathyroidism, who was admitted to [facility] on 01/08/2016  1. End Stage Renal Disease: seen and examined on hemodialysis. Will need to have outpatient set [place]. Discussed with dialysis liason.  Tolerating dialysis treatment well.   2. Hypertension: elevated as he has not had his home blood pressure medications.   3. Anemia of chronic kidney disease: hemoglobin of 10.3. Do not have any outpatient records so will not offer epo.   4. Secondary  Hyperparathyroidism: calcium at goal. No phosphorus or PTH available.      LOS:  Wynelle LinkKOLLURU, Threasa HeadsSARATH 3/7/20171:33 PM

## 2016-01-08 NOTE — ED Notes (Signed)
Pt transported to dialysis via stretcher.

## 2016-01-08 NOTE — Progress Notes (Signed)
Hemodialysis start 

## 2016-01-08 NOTE — Progress Notes (Signed)
ANTIBIOTIC CONSULT NOTE - INITIAL  Pharmacy Consult for Levofloxacin Indication: pneumonia  No Known Allergies  Patient Measurements: Height: 5\' 7"  (170.2 cm) Weight: 156 lb 15.5 oz (71.2 kg) IBW/kg (Calculated) : 66.1 Adjusted Body Weight:   Vital Signs: Temp: 97.7 F (36.5 C) (03/07 0945) Temp Source: Oral (03/07 0945) BP: 209/75 mmHg (03/07 1130) Pulse Rate: 65 (03/07 1200) Intake/Output from previous day:   Intake/Output from this shift:    Labs:  Recent Labs  01/07/16 1707 01/08/16 0731 01/08/16 1027  WBC 6.9 6.1  --   HGB 10.2* 10.3*  --   PLT 156 160  --   CREATININE 11.14* 11.75* 12.18*   Estimated Creatinine Clearance: 5.2 mL/min (by C-G formula based on Cr of 12.18). No results for input(s): VANCOTROUGH, VANCOPEAK, VANCORANDOM, GENTTROUGH, GENTPEAK, GENTRANDOM, TOBRATROUGH, TOBRAPEAK, TOBRARND, AMIKACINPEAK, AMIKACINTROU, AMIKACIN in the last 72 hours.   Microbiology: No results found for this or any previous visit (from the past 720 hour(s)).  Medical History: Past Medical History  Diagnosis Date  . Renal disorder     ESRD, dialysis  . Diabetes mellitus without complication (HCC)   . Hypertension   . End-stage renal disease on hemodialysis (HCC)     Medications:   (Not in a hospital admission) Scheduled:  . insulin aspart  0-5 Units Subcutaneous QHS  . insulin aspart  0-9 Units Subcutaneous TID WC  . [START ON 01/10/2016] levofloxacin  250 mg Oral Q48H  . levofloxacin  500 mg Oral Once   Assessment: Pharmacy consulted to dose and monitor levofloxacin in this 70 year old male being treated for possible pneumonia. Patient is ESRD requiring hemodialysis.   Goal of Therapy:  Resolution of infection   Plan:  Patient received levofloxacin 500 mg PO x 1. Will starrt levofloxacin 250 mg PO q48 hours.   Pharmacy to follow.   Adalea Handler D 01/08/2016,12:35 PM

## 2016-01-08 NOTE — Care Management Obs Status (Signed)
MEDICARE OBSERVATION STATUS NOTIFICATION   Patient Details  Name: Jermaine Carpenter MRN: [id number] Date of Birth: 05/17/1946   Medicare Observation Status Notification Given:  Yes    Berna BueCheryl Kynzee Devinney, RN 01/08/2016, 9:09 AM

## 2016-01-09 LAB — HEPATITIS B SURFACE ANTIGEN: Hepatitis B Surface Ag: NEGATIVE

## 2016-01-09 LAB — HEPATITIS B CORE ANTIBODY, TOTAL: Hep B Core Total Ab: NEGATIVE

## 2016-01-09 LAB — PARATHYROID HORMONE, INTACT (NO CA): PTH: 143 pg/mL — ABNORMAL HIGH (ref 15–65)

## 2016-01-09 LAB — HEPATITIS B SURFACE ANTIBODY,QUALITATIVE: Hep B S Ab: NONREACTIVE

## 2016-01-17 NOTE — Discharge Summary (Signed)
  This serves as a discharge summary for patient Jermaine Carpenter  For detailed no please take a look at a history of physical done by me on the day of admission 01/08/2016.  Patient left AGAINST MEDICAL ADVICE shortly after admission.  Hospital course:  70 year old male with past medical history of end-stage renal disease on hemodialysis, hypertension, diabetes who presents to the hospital having missed his dialysis this past Monday.  #1 end-stage renal disease on hemodialysis-patient was traveling from [place] due to a death in his family. He did not get dialysis arranged prior to his travel. -Patient has some mild volume overload having missed his dialysis 2 days ago. We'll consulted nephrology and got him dialysis.   #2 pneumonia-patient has a productive cough with green-yellow sputum over the past few days. -Clinically he is afebrile, hemodynamically stable with a normal white cell count. -He was started on Levaquin.   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  #4. Troponin-this is likely secondary to patient's poor renal clearance. No acute chest pain. -No need for further cardiac workup presently.  #5 anemia of chronic disease- due to ESRD.  - hemoglobin stable we will monitor.   Patient got hemodialysis and shortly after finishing his treatment he refused to stay in the hospital for further evaluation and therefore left AGAINST MEDICAL ADVICE.

## 2016-04-17 IMAGING — DX DG CHEST 1V PORT
1 series · 1 of 1 positions shown · non-contrast
Comparison: Radiographs 06/04/2010 and 01/04/2000)

CLINICAL DATA: Renal failure patient with history of smoking,
diabetes and hypertension.

EXAM:
PORTABLE CHEST 1 VIEW

[chest ap]
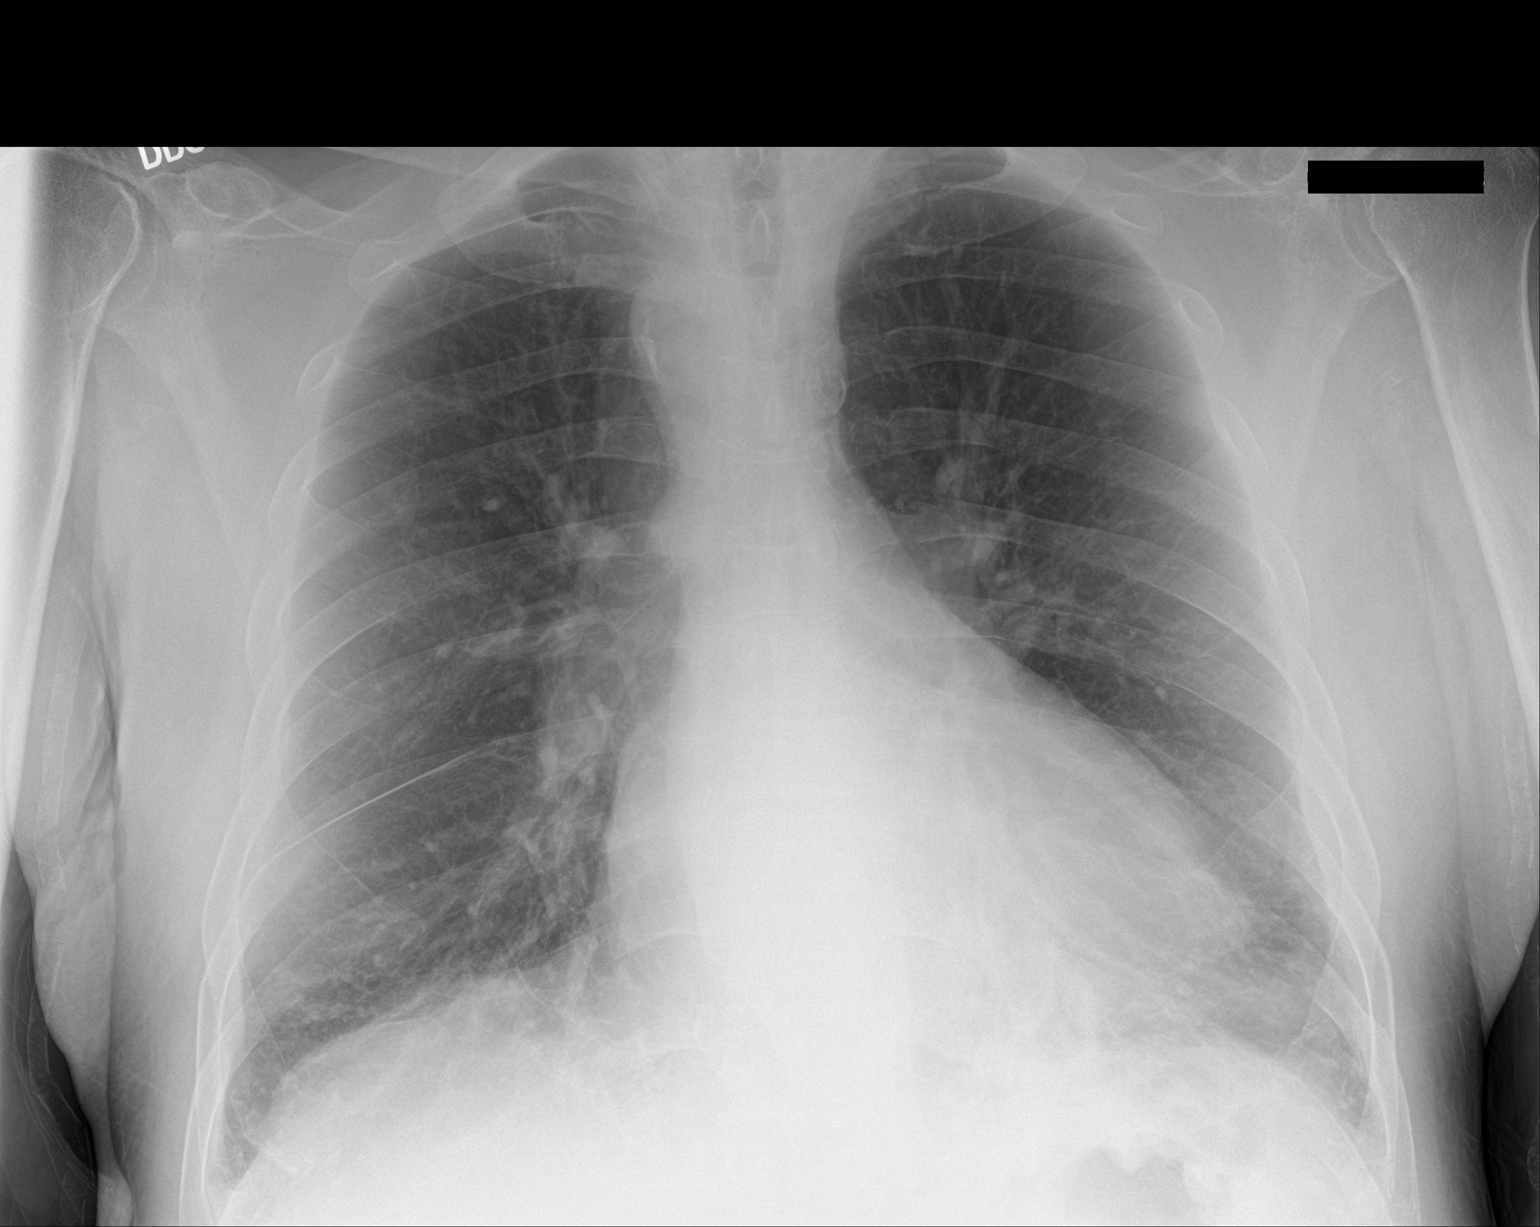

[1 of 1 positions shown; findings below may reference images not displayed]

FINDINGS: has a right-sided aortic arch. The pulmonary vascularity appears
normal. There are patchy airspace opacities at both lung bases. No
pleural effusion or pneumothorax is seen. The bones appear
unremarkable.
IMPRESSION: Patchy bibasilar atelectasis or early pneumonia. Correlate
clinically. No evidence of edema.

## 2016-04-17 IMAGING — CR DG CHEST 2V
2 series · 2 of 2 positions shown · non-contrast
Comparison: 01/08/2016

CLINICAL DATA: Coughing for 15 days. Productive cough. [REDACTED] to
R/O TB. Former smoker.Hx of hypertension, end stage renal disease,
diabetes.

EXAM:
CHEST  2 VIEW

[chest pa]
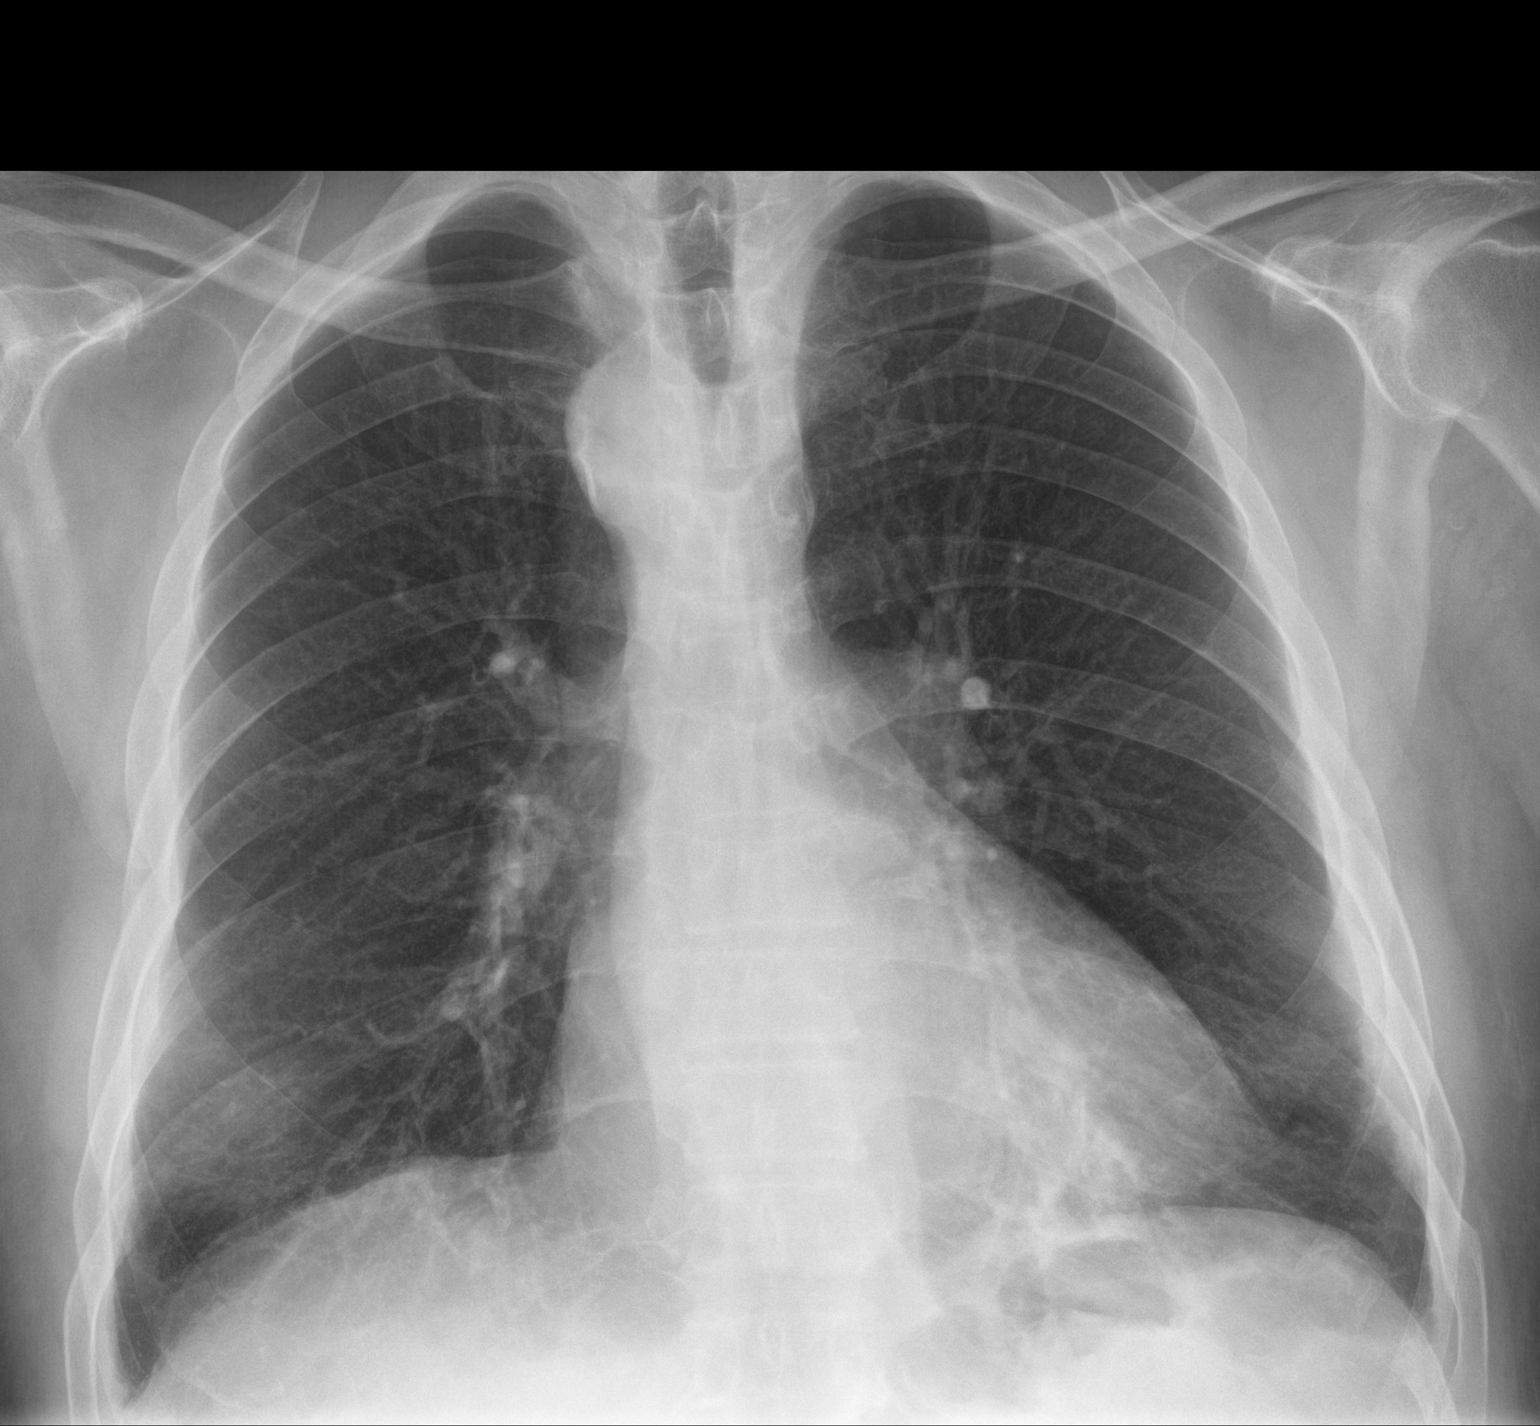

[chest lat]
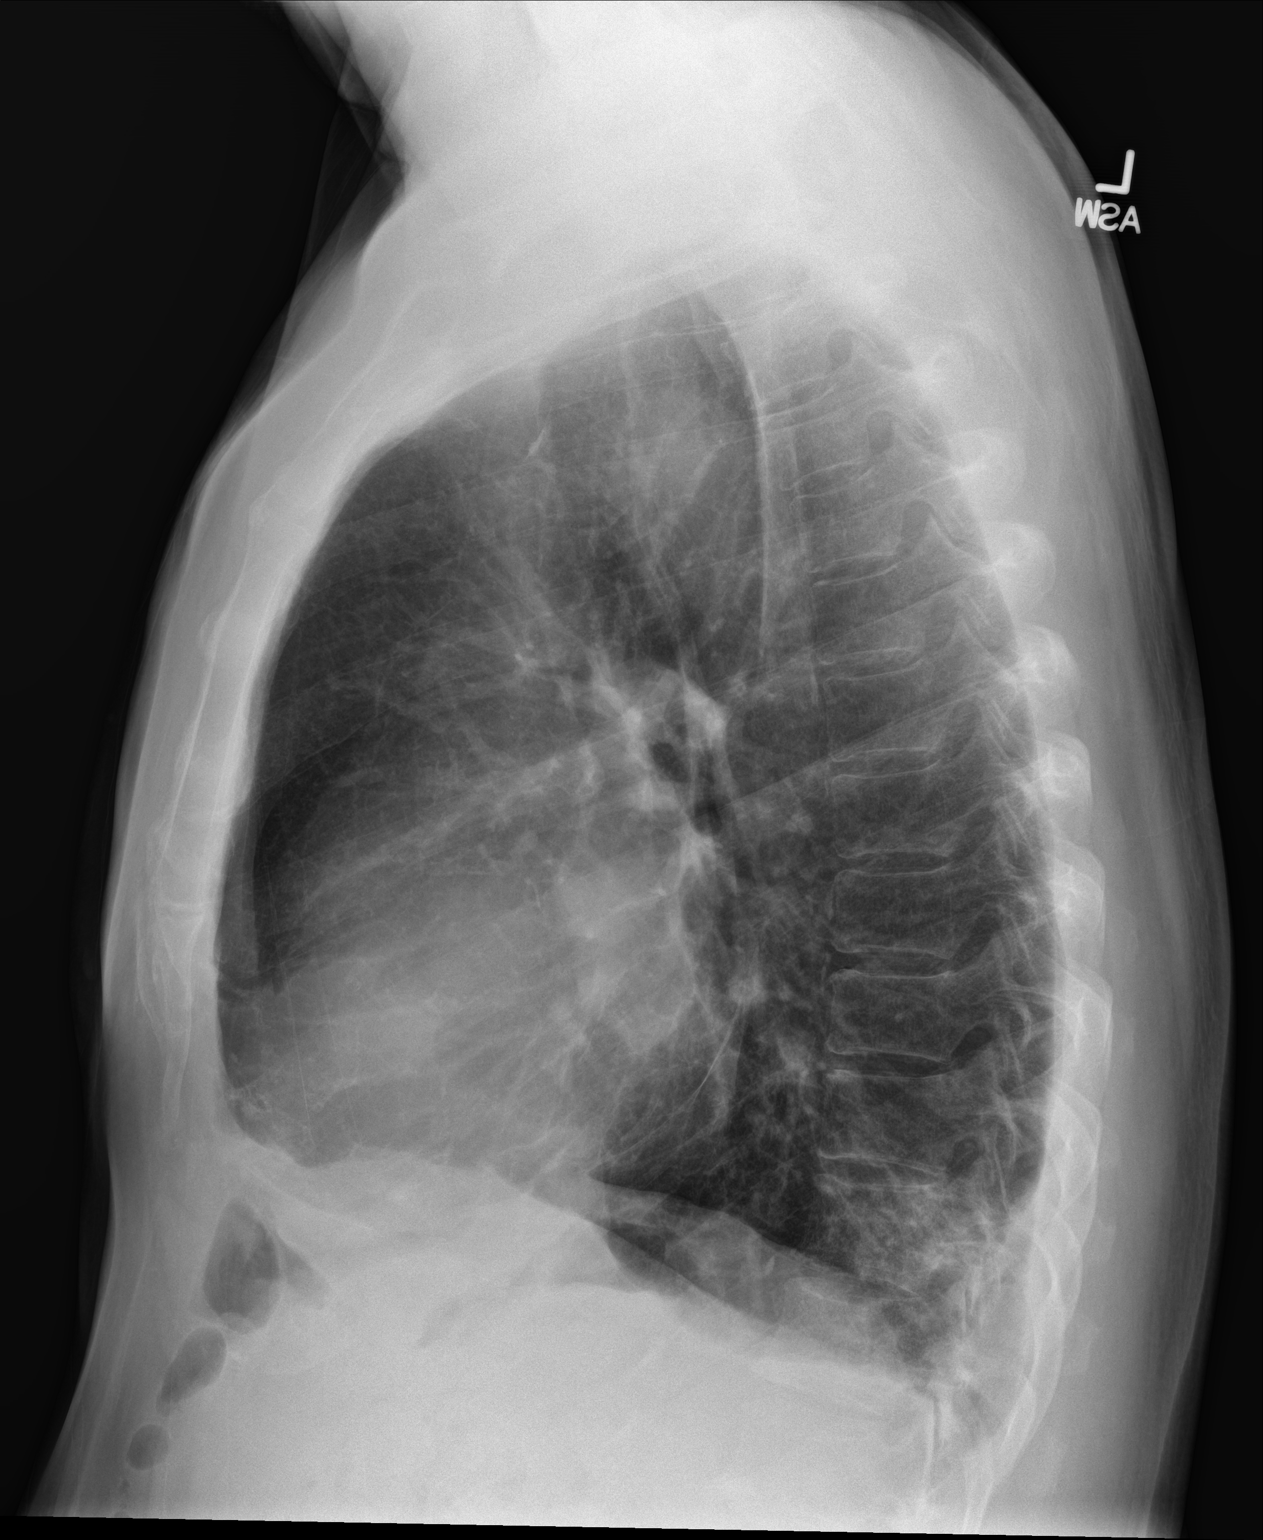

[2 of 2 positions shown; findings below may reference images not displayed]

FINDINGS: Cardiac silhouette is mildly enlarged. Right-sided aortic arch. No
mediastinal or hilar masses or evidence of adenopathy.

Lungs are hyperexpanded.

There is streaky opacity in the posterior, medial left lower lobe at
the lung base. This is similar to the prior study. It may reflect
atelectasis/scarring. In light of this patient's history, pneumonia
is suspected.

Remainder of the lungs is clear. No pleural effusion or
pneumothorax.

Bony thorax is intact.
IMPRESSION: Left lower lobe pneumonia.
